# Patient Record
Sex: Female | Born: 1960 | Race: White | Hispanic: No | Smoking: Never smoker
Health system: Southern US, Community
[De-identification: ages and names within clinical notes are randomized; demographics above are authoritative.]

## PROBLEM LIST (undated history)

## (undated) DIAGNOSIS — F419 Anxiety disorder, unspecified: Secondary | ICD-10-CM

## (undated) DIAGNOSIS — F4310 Post-traumatic stress disorder, unspecified: Secondary | ICD-10-CM

## (undated) HISTORY — PX: ABDOMINAL HYSTERECTOMY: SHX81

---

## 2016-08-30 ENCOUNTER — Ambulatory Visit
Admission: EM | Admit: 2016-08-30 | Discharge: 2016-08-30 | Disposition: A | Discharge status: Home / Self Care | Payer: 59 | Attending: Family Medicine | Admitting: Family Medicine

## 2016-08-30 DIAGNOSIS — R070 Pain in throat: Secondary | ICD-10-CM | POA: Diagnosis not present

## 2016-08-30 DIAGNOSIS — J029 Acute pharyngitis, unspecified: Secondary | ICD-10-CM | POA: Diagnosis not present

## 2016-08-30 NOTE — ED Triage Notes (Signed)
Patient complains of sore throat on the outside of her throat when she drops her head. Patient states that she feels like the area is bruised. Patient reports that she noticed discomfort starting on Monday reporting that she felt like she had an "air bubble" in her throat. Patient reports that pain started today.

## 2016-08-30 NOTE — Discharge Instructions (Signed)
Over the counter omeprazole (prilosec)  Continue allergy medications If no improvement/resolution, would recommend follow up evaluation with ENT

## 2016-10-02 NOTE — ED Provider Notes (Signed)
[facility]    CSN: [id number] Arrival date & time: 08/30/16  1547     History   Chief Complaint Chief Complaint  Patient presents with  . Sore Throat    HPI Sherri Powers is a 56 y.o. female.   56 yo female with a c/o throat discomfort that feels more on the outside. Discomfort is worse when dropping her head to look down.  Patient states that she feels like the area is bruised. Patient reports that she noticed discomfort starting on Monday reporting that she felt like she had an "air bubble" in her throat. Patient reports that pain started today. Denies pain with a swallowing, fevers, chills, cough, runny nose.        The history is provided by the patient.  Sore Throat     History reviewed. No pertinent past medical history.  There are no active problems to display for this patient.   Past Surgical History:  Procedure Laterality Date  . ABDOMINAL HYSTERECTOMY      OB History    No data available       Home Medications    Prior to Admission medications   Medication Sig Start Date End Date Taking? Authorizing Provider  buPROPion (WELLBUTRIN XL) 300 MG 24 hr tablet Take 300 mg by mouth daily.   Yes [provider]  venlafaxine (EFFEXOR) 37.5 MG tablet Take 37.5 mg by mouth 2 (two) times daily.   Yes [provider]    Family History History reviewed. No pertinent family history.  Social History Social History  Substance Use Topics  . Smoking status: Never Smoker  . Smokeless tobacco: Never Used  . Alcohol use Yes     Comment: occasionally     Allergies   Codeine; Morphine and related; and Sulfa antibiotics   Review of Systems Review of Systems   Physical Exam Triage Vital Signs ED Triage Vitals  Enc Vitals Group     BP 08/30/16 1614 138/79     Pulse Rate 08/30/16 1614 80     Resp 08/30/16 1614 18     Temp 08/30/16 1614 98.4 F (36.9 C)     Temp Source 08/30/16 1614 Oral     SpO2 08/30/16 1614 100 %    Weight 08/30/16 1611 175 lb (79.4 kg)     Height 08/30/16 1611 5\' 4"  (1.626 m)     Head Circumference --      Peak Flow --      Pain Score 08/30/16 1611 1     Pain Loc --      Pain Edu? --      Excl. in GC? --    No data found.   Updated Vital Signs BP 138/79 (BP Location: Left Arm)   Pulse 80   Temp 98.4 F (36.9 C) (Oral)   Resp 18   Ht 5\' 4"  (1.626 m)   Wt 175 lb (79.4 kg)   SpO2 100%   BMI 30.04 kg/m   Visual Acuity Right Eye Distance:   Left Eye Distance:   Bilateral Distance:    Right Eye Near:   Left Eye Near:    Bilateral Near:     Physical Exam  Constitutional: She appears well-developed and well-nourished. No distress.  HENT:  Head: Normocephalic and atraumatic.  Right Ear: Tympanic membrane, external ear and ear canal normal.  Left Ear: Tympanic membrane, external ear and ear canal normal.  Nose: No mucosal edema, rhinorrhea, nose lacerations, sinus tenderness, nasal deformity,  septal deviation or nasal septal hematoma. No epistaxis.  No foreign bodies. Right sinus exhibits no maxillary sinus tenderness and no frontal sinus tenderness. Left sinus exhibits no maxillary sinus tenderness and no frontal sinus tenderness.  Mouth/Throat: Uvula is midline, oropharynx is clear and moist and mucous membranes are normal. No oropharyngeal exudate.  Eyes: Conjunctivae and EOM are normal. Pupils are equal, round, and reactive to light. Right eye exhibits no discharge. Left eye exhibits no discharge. No scleral icterus.  Neck: Normal range of motion. Neck supple. No thyromegaly present.  Cardiovascular: Normal rate, regular rhythm and normal heart sounds.   Pulmonary/Chest: Effort normal and breath sounds normal. No respiratory distress. She has no wheezes. She has no rales.  Lymphadenopathy:    She has no cervical adenopathy.  Skin: She is not diaphoretic.  Nursing note and vitals reviewed.    UC Treatments / Results  Labs (all labs ordered are listed, but only  abnormal results are displayed) Labs Reviewed - No data to display  EKG  EKG Interpretation None       Radiology No results found.  Procedures Procedures (including critical care time)  Medications Ordered in UC Medications - No data to display   Initial Impression / Assessment and Plan / UC Course  I have reviewed the triage vital signs and the nursing notes.  Pertinent labs & imaging results that were available during my care of the patient were reviewed by me and considered in my medical decision making (see chart for details).       Final Clinical Impressions(s) / UC Diagnoses   Final diagnoses:  Throat discomfort    New Prescriptions Discharge Medication List as of 08/30/2016  4:59 PM     1. diagnosis reviewed with patient 2. Recommend supportive treatment with otc analgesics, ice to area 3. Follow-up prn if symptoms worsen or don't improve   Payton Mccallumonty, Kevan Prouty, MD 10/02/16 1309

## 2018-05-18 ENCOUNTER — Other Ambulatory Visit: Payer: Self-pay | Admitting: Family Medicine

## 2018-05-18 DIAGNOSIS — Z1231 Encounter for screening mammogram for malignant neoplasm of breast: Secondary | ICD-10-CM

## 2018-08-17 ENCOUNTER — Emergency Department
Admission: EM | Admit: 2018-08-17 | Discharge: 2018-08-17 | Disposition: A | Discharge status: Home / Self Care | Payer: 59 | Attending: Emergency Medicine | Admitting: Emergency Medicine

## 2018-08-17 ENCOUNTER — Encounter: Payer: Self-pay | Admitting: Emergency Medicine

## 2018-08-17 ENCOUNTER — Ambulatory Visit (INDEPENDENT_AMBULATORY_CARE_PROVIDER_SITE_OTHER)
Admission: EM | Admit: 2018-08-17 | Discharge: 2018-08-17 | Disposition: A | Discharge status: Nursing Home (Includes ICF) | Payer: 59 | Source: Home / Self Care | Attending: Family Medicine | Admitting: Family Medicine

## 2018-08-17 ENCOUNTER — Emergency Department: Payer: 59

## 2018-08-17 ENCOUNTER — Other Ambulatory Visit: Payer: Self-pay

## 2018-08-17 DIAGNOSIS — R0789 Other chest pain: Secondary | ICD-10-CM | POA: Insufficient documentation

## 2018-08-17 DIAGNOSIS — Z79899 Other long term (current) drug therapy: Secondary | ICD-10-CM | POA: Insufficient documentation

## 2018-08-17 DIAGNOSIS — R0602 Shortness of breath: Secondary | ICD-10-CM | POA: Diagnosis not present

## 2018-08-17 DIAGNOSIS — R079 Chest pain, unspecified: Secondary | ICD-10-CM

## 2018-08-17 HISTORY — DX: Post-traumatic stress disorder, unspecified: F43.10

## 2018-08-17 HISTORY — DX: Anxiety disorder, unspecified: F41.9

## 2018-08-17 LAB — COMPREHENSIVE METABOLIC PANEL
ALT: 21 U/L (ref 0–44)
AST: 23 U/L (ref 15–41)
Albumin: 4 g/dL (ref 3.5–5.0)
Alkaline Phosphatase: 76 U/L (ref 38–126)
Anion gap: 8 (ref 5–15)
BUN: 16 mg/dL (ref 6–20)
CO2: 22 mmol/L (ref 22–32)
Calcium: 8.7 mg/dL — ABNORMAL LOW (ref 8.9–10.3)
Chloride: 111 mmol/L (ref 98–111)
Creatinine, Ser: 0.93 mg/dL (ref 0.44–1.00)
GFR calc Af Amer: >60 mL/min (ref >60)
GFR calc non Af Amer: >60 mL/min (ref >60)
Glucose, Bld: 117 mg/dL — ABNORMAL HIGH (ref 70–99)
Potassium: 3.7 mmol/L (ref 3.5–5.1)
Sodium: 141 mmol/L (ref 135–145)
Total Bilirubin: 0.8 mg/dL (ref 0.3–1.2)
Total Protein: 7 g/dL (ref 6.5–8.1)

## 2018-08-17 LAB — CBC WITH DIFFERENTIAL/PLATELET
Abs Immature Granulocytes: 0.01 10*3/uL (ref 0.00–0.07)
Basophils Absolute: 0.1 10*3/uL (ref 0.0–0.1)
Basophils Relative: 1 %
Eosinophils Absolute: 0.3 10*3/uL (ref 0.0–0.5)
Eosinophils Relative: 4 %
HCT: 40.8 % (ref 36.0–46.0)
Hemoglobin: 13.9 g/dL (ref 12.0–15.0)
Immature Granulocytes: 0 %
Lymphocytes Relative: 35 %
Lymphs Abs: 2.5 10*3/uL (ref 0.7–4.0)
MCH: 31.9 pg (ref 26.0–34.0)
MCHC: 34.1 g/dL (ref 30.0–36.0)
MCV: 93.6 fL (ref 80.0–100.0)
Monocytes Absolute: 0.8 10*3/uL (ref 0.1–1.0)
Monocytes Relative: 11 %
Neutro Abs: 3.6 10*3/uL (ref 1.7–7.7)
Neutrophils Relative %: 49 %
Platelets: 293 10*3/uL (ref 150–400)
RBC: 4.36 MIL/uL (ref 3.87–5.11)
RDW: 12.2 % (ref 11.5–15.5)
WBC: 7.3 10*3/uL (ref 4.0–10.5)
nRBC: 0 % (ref 0.0–0.2)

## 2018-08-17 LAB — URINALYSIS, COMPLETE (UACMP) WITH MICROSCOPIC
Bilirubin Urine: NEGATIVE
Glucose, UA: NEGATIVE mg/dL
Hgb urine dipstick: NEGATIVE
Ketones, ur: NEGATIVE mg/dL
Nitrite: NEGATIVE
Protein, ur: NEGATIVE mg/dL
Specific Gravity, Urine: 1.023 (ref 1.005–1.030)
pH: 5 (ref 5.0–8.0)

## 2018-08-17 LAB — TROPONIN I
Troponin I: <0.03 ng/mL (ref <0.03)
Troponin I: <0.03 ng/mL (ref <0.03)

## 2018-08-17 MED ORDER — ASPIRIN 81 MG PO CHEW
324.0000 mg | CHEWABLE_TABLET | Freq: Once | ORAL | Status: AC
  Administered 2018-08-17: 18:00:00 324 mg via ORAL

## 2018-08-17 MED ORDER — NITROGLYCERIN 2 % TD OINT
0.5000 [in_us] | TOPICAL_OINTMENT | Freq: Once | TRANSDERMAL | Status: AC
  Administered 2018-08-17: 18:00:00 0.5 [in_us] via TOPICAL

## 2018-08-17 MED ORDER — ASPIRIN EC 325 MG PO TBEC: 325.0000 mg | DELAYED_RELEASE_TABLET | Freq: Every day | ORAL | 0 refills | Status: DC

## 2018-08-17 NOTE — ED Provider Notes (Signed)
[facility]    CSN: [id number] Arrival date & time: 08/17/18  1745     History   Chief Complaint Chief Complaint  Patient presents with  . Chest Pain    HPI Sherri Powers is a 58 y.o. female.   58 yo female with a c/o left upper chest pressure radiating into her left neck, jaw and left shoulder area since 11 am this morning. States pressure has been constant and 5/10. Denies shortness of breath, cough, fevers, wheezing. States over the last week she's been feeling "palpitations" or her heart "fluttering".  Risk factors: hypercholesterolemia, family history  The history is provided by the patient.  Chest Pain  Pain location:  L lateral chest   Past Medical History:  Diagnosis Date  . Anxiety   . PTSD (post-traumatic stress disorder)     There are no active problems to display for this patient.   Past Surgical History:  Procedure Laterality Date  . ABDOMINAL HYSTERECTOMY      OB History   No obstetric history on file.      Home Medications    Prior to Admission medications   Medication Sig Start Date End Date Taking? Authorizing Provider  buPROPion (WELLBUTRIN XL) 300 MG 24 hr tablet Take 300 mg by mouth daily.   Yes [provider]  hydrALAZINE (APRESOLINE) 10 MG tablet Take 10 mg by mouth 3 (three) times daily.   Yes [provider]  venlafaxine (EFFEXOR) 37.5 MG tablet Take 37.5 mg by mouth 2 (two) times daily.   Yes [provider]    Family History History reviewed. No pertinent family history.  Social History Social History   Tobacco Use  . Smoking status: Never Smoker  . Smokeless tobacco: Never Used  Substance Use Topics  . Alcohol use: Yes    Comment: occasionally  . Drug use: No     Allergies   Codeine; Morphine and related; and Sulfa antibiotics   Review of Systems Review of Systems  Cardiovascular: Positive for chest pain.     Physical Exam Triage Vital Signs ED Triage Vitals  Enc  Vitals Group     BP 08/17/18 1755 140/77     Pulse Rate 08/17/18 1755 72     Resp 08/17/18 1755 18     Temp 08/17/18 1755 98.4 F (36.9 C)     Temp Source 08/17/18 1755 Oral     SpO2 08/17/18 1755 99 %     Weight 08/17/18 1752 178 lb (80.7 kg)     Height 08/17/18 1752 5\' 4"  (1.626 m)     Head Circumference --      Peak Flow --      Pain Score 08/17/18 1752 7     Pain Loc --      Pain Edu? --      Excl. in GC? --    No data found.  Updated Vital Signs BP 140/77 (BP Location: Right Arm)   Pulse 72   Temp 98.4 F (36.9 C) (Oral)   Resp 18   Ht 5\' 4"  (1.626 m)   Wt 80.7 kg   SpO2 99%   BMI 30.55 kg/m   Visual Acuity Right Eye Distance:   Left Eye Distance:   Bilateral Distance:    Right Eye Near:   Left Eye Near:    Bilateral Near:     Physical Exam Vitals signs reviewed.  Constitutional:      General: She is not in acute distress.  Appearance: She is not ill-appearing, toxic-appearing or diaphoretic.  Cardiovascular:     Rate and Rhythm: Normal rate and regular rhythm.     Pulses: Normal pulses.     Heart sounds: Normal heart sounds.  Pulmonary:     Effort: Pulmonary effort is normal. No respiratory distress.     Breath sounds: Normal breath sounds. No stridor. No wheezing, rhonchi or rales.  Musculoskeletal:     Right lower leg: No edema.     Left lower leg: No edema.  Neurological:     Mental Status: She is alert.      UC Treatments / Results  Labs (all labs ordered are listed, but only abnormal results are displayed) Labs Reviewed - No data to display  EKG None  Radiology No results found.  Procedures ED EKG Date/Time: 08/17/2018 6:23 PM Performed by: Payton Mccallumonty, Asheton Viramontes, MD Authorized by: Payton Mccallumonty, Dinesha Twiggs, MD   ECG reviewed by ED Physician in the absence of a cardiologist: yes   Previous ECG:    Previous ECG:  Compared to current   Similarity:  Changes noted Interpretation:    Interpretation: abnormal   Rate:    ECG rate assessment:  normal   Rhythm:    Rhythm: sinus rhythm   Ectopy:    Ectopy: none   QRS:    QRS axis:  Normal Conduction:    Conduction: normal   ST segments:    ST segments:  Non-specific T waves:    T waves: flattening and inverted     Flattening:  V2   Inverted:  V1   (including critical care time)  Medications Ordered in UC Medications  aspirin chewable tablet 324 mg (324 mg Oral Given 08/17/18 1819)  nitroGLYCERIN (NITROGLYN) 2 % ointment 0.5 inch (0.5 inches Topical Given 08/17/18 1820)    Initial Impression / Assessment and Plan / UC Course  I have reviewed the triage vital signs and the nursing notes.  Pertinent labs & imaging results that were available during my care of the patient were reviewed by me and considered in my medical decision making (see chart for details).      Final Clinical Impressions(s) / UC Diagnoses   Final diagnoses:  Left chest pressure    ED Prescriptions    None      1. Symptoms, ekg results and potential diagnoses reviewed with patient; recommend patient go to ED by EMS for further evaluation and management; patient given ASA, 1/2 in Nitropaste, IV saline lock. Patient in stable condition. [facility] ED.     Controlled Substance Prescriptions [place] Controlled Substance Registry consulted? Not Applicable   Payton Mccallumonty, Ilianna Bown, MD 08/17/18 [phone]

## 2018-08-17 NOTE — ED Triage Notes (Signed)
Pt presents to ED via [facility] from [facility] c/o L-sided chest pressure starting today and radiating into L arm. Reports feeling of fluttering in chest over the last few weeks. Pt given 324mg  ASA and 1" nitro paste to L chest at Urgent Care. Pt rated chest pressure 7/10 at [facility] and 4/10 on arrival to ED. Denies SOB.

## 2018-08-17 NOTE — ED Triage Notes (Addendum)
Patient c/o chest pressure that radiates into her left arm and left shoulder that started today. She states over the last 2 weeks she has woken up out of her sleep twice with heart fluttering.

## 2018-08-17 NOTE — ED Provider Notes (Signed)
[facility] Emergency Department Provider Note  ____________________________________________   None    (approximate)  I have reviewed the triage vital signs and the nursing notes.   HISTORY  Chief Complaint Chest Pain    HPI Sherri Powers is a 58 y.o. female presents emergency department from  urgent care complaining of chest pain and shortness of breath.  Pain radiates into the left jaw and left shoulder.  Some into the left arm.  She has been having some heart fluttering and palpitations over the past week.  She denies any pain in her legs.  No abdominal pain.  No vomiting.  She was given 4 aspirin and a nitro patch while at the  urgent care.   Patient's mother had a history of A. fib , father had a MI at age 60.  Patient has mildly elevated cholesterol in which she is not on medication for.  History of anxiety.  Non-smoker, patient also had a tick bite in March and has had a low-grade temp for the past week.  No known exposure to covid.  Past Medical History:  Diagnosis Date  . Anxiety   . PTSD (post-traumatic stress disorder)     There are no active problems to display for this patient.   Past Surgical History:  Procedure Laterality Date  . ABDOMINAL HYSTERECTOMY      Prior to Admission medications   Medication Sig Start Date End Date Taking? Authorizing Provider  buPROPion (WELLBUTRIN XL) 300 MG 24 hr tablet Take 300 mg by mouth daily.    [provider]  hydrALAZINE (APRESOLINE) 10 MG tablet Take 10 mg by mouth 3 (three) times daily.    [provider]  venlafaxine (EFFEXOR) 37.5 MG tablet Take 37.5 mg by mouth 2 (two) times daily.    [provider]    Allergies Codeine; Morphine and related; and Sulfa antibiotics  History reviewed. No pertinent family history.  Social History Social History   Tobacco Use  . Smoking status: Never Smoker  . Smokeless tobacco: Never Used  Substance Use Topics  . Alcohol  use: Yes    Comment: occasionally  . Drug use: No    Review of Systems  Constitutional: No fever/chills Eyes: No visual changes. ENT: No sore throat. Respiratory: Denies cough Cardiovascular: Positive for chest pain Genitourinary: Negative for dysuria. Musculoskeletal: Negative for back pain. Skin: Negative for rash.    ____________________________________________   PHYSICAL EXAM:  VITAL SIGNS: ED Triage Vitals  Enc Vitals Group     BP 08/17/18 1911 (!) 147/92     Pulse Rate 08/17/18 1911 69     Resp 08/17/18 1911 16     Temp 08/17/18 1911 99 F (37.2 C)     Temp Source 08/17/18 1911 Oral     SpO2 08/17/18 1911 99 %     Weight 08/17/18 1913 178 lb (80.7 kg)     Height 08/17/18 1913 5\' 4"  (1.626 m)     Head Circumference --      Peak Flow --      Pain Score 08/17/18 1912 4     Pain Loc --      Pain Edu? --      Excl. in GC? --     Constitutional: Alert and oriented. Well appearing and in no acute distress. Eyes: Conjunctivae are normal.  Head: Atraumatic. Nose: No congestion/rhinnorhea. Mouth/Throat: Mucous membranes are moist.   Neck:  supple no lymphadenopathy noted Cardiovascular: Normal rate, regular rhythm. Heart  sounds are normal Respiratory: Normal respiratory effort.  No retractions, lungs c t a  Abd: soft nontender bs normal all 4 quad GU: deferred Musculoskeletal: FROM all extremities, warm and well perfused Neurologic:  Normal speech and language.  Skin:  Skin is warm, dry and intact. No rash noted. Psychiatric: Mood and affect are normal. Speech and behavior are normal.  ____________________________________________   LABS (all labs ordered are listed, but only abnormal results are displayed)  Labs Reviewed  COMPREHENSIVE METABOLIC PANEL - Abnormal; Notable for the following components:      Result Value   Glucose, Bld 117 (*)    Calcium 8.7 (*)    All other components within normal limits  URINALYSIS, COMPLETE (UACMP) WITH MICROSCOPIC -  Abnormal; Notable for the following components:   Color, Urine YELLOW (*)    APPearance HAZY (*)    Leukocytes,Ua LARGE (*)    Bacteria, UA RARE (*)    All other components within normal limits  TROPONIN I  CBC WITH DIFFERENTIAL/PLATELET  TROPONIN I   ____________________________________________   ____________________________________________  RADIOLOGY  Chest x-ray is normal  ____________________________________________   PROCEDURES  Procedure(s) performed: No  Procedures    ____________________________________________   INITIAL IMPRESSION / ASSESSMENT AND PLAN / ED COURSE  Pertinent labs & imaging results that were available during my care of the patient were reviewed by me and considered in my medical decision making (see chart for details).   Patient is a 58 year old female presents emergency department chest pain which radiates into the left side of the neck and left shoulder.  She was seen at the urgent care and given 4 aspirin and a nitro patch.  Physical exam patient appears well.  Blood pressure is mildly elevated at 147/92.  Remainder the exam is basically unremarkable.    ----------------------------------------- 8:35 PM on 08/17/2018 -----------------------------------------  Troponin is normal, comprehensive metabolic panel is normal, CBC is normal, urinalysis shows large amount of leuks with WBCs at 11-12 with rare bacteria  Explained the test results to the patient.  Explained her we will do a second troponin at 10:00.  She states she still feels the pressure of something pushing forward in her chest. Discussed with Dr. Scotty CourtStafford.  As part of my medical decision making, I reviewed the following data within the electronic MEDICAL RECORD NUMBER Nursing notes reviewed and incorporated, Labs reviewed see above, EKG interpreted NSR, Old chart reviewed, Radiograph reviewed chest x-ray is normal, Evaluated by EM attending Dr. Scotty CourtStafford, Notes from prior ED visits and  [place] Controlled Substance Database  ____________________________________________   FINAL CLINICAL IMPRESSION(S) / ED DIAGNOSES  Final diagnoses:  Nonspecific chest pain      NEW MEDICATIONS STARTED DURING THIS VISIT:  New Prescriptions   No medications on file     Note:  This document was prepared using Dragon voice recognition software and may include unintentional dictation errors.    Faythe GheeFisher,  W, PA-C 08/17/18 2101    Sharman CheekStafford, Phillip, MD 08/17/18 2316    Sharman CheekStafford, Phillip, MD 08/17/18 2317

## 2018-08-18 NOTE — Progress Notes (Signed)
Virtual Visit via Video Note   This visit type was conducted due to national recommendations for restrictions regarding the COVID-19 Pandemic (e.g. social distancing) in an effort to limit this patient's exposure and mitigate transmission in our community.  Due to her co-morbid illnesses, this patient is at least at moderate risk for complications without adequate follow up.  This format is felt to be most appropriate for this patient at this time.  All issues noted in this document were discussed and addressed.  A limited physical exam was performed with this format.  Please refer to the patient's chart for her consent to telehealth for [facility].   Evaluation Performed:  New patient visit  Date:  08/19/2018   ID:  Sherri Powers, DOB 05-24-1960, MRN [id number]  Patient Location: Home Provider Location: Office  PCP:  [facility]  Cardiologist:  New - Yvonne Kendallhristopher Sybilla Malhotra, MD Electrophysiologist:  None   Chief Complaint: Palpitations and chest pain  History of Present Illness:    Sherri CellarMeredith Powers is a 58 y.o. female with history of anxiety and PTSD, whom I am seeing for evaluation of chest pain.  She presented to [facility] yesterday complaining of left upper chest pain radiating to the left shoulder and neck that began around 11 AM.  She also noted palpitations over the last week.  She was referred to [facility] ED by EMS for further evaluation.  There, workup was largely unrevealing (EKG with non-specific ST changes but otherwise normal; negative troponin x 2) and the patient discharged with plan for close cardiology follow-up.  Today, Sherri Powers reports that her symptoms began in March when she was forced to start working from home in the setting of COVID-19 precautions.  She intermittently experienced palpitations during which it would feel like her heart was flipping and flopping in her chest.  Symptoms are most pronounced at night.  2 days ago, she began noticing  pressure along the medial aspect of her left upper arm.  Over the course of 90 minutes, it began to migrate medially towards her left shoulder and upper chest.  Maximal intensity was 7/10.  She felt a little lightheaded, but otherwise there were no associated symptoms.  Because of the worsening discomfort, she proceeded to urgent care and was subsequently sent to [facility] emergency department, as noted above.  Pain resolved on its own shortly after arriving in the ED.  It had not recurred, though she notes transient pressure near the left clavicle at the Arliss Hepburn of our visit today.  Sherri Powers has been more sedentary over the last 2 months, working from home.  However, she tries to walk on her property and is able to do so without chest pain or shortness of breath.  She also denies orthopnea, PND, and edema.  She does not have a history of cardiac disease nor has she undergone cardiac testing in the past.  She consumes 1 cup of caffeinated coffee per day.  The patient does not have symptoms concerning for COVID-19 infection (fever, chills, cough, or new shortness of breath).    Past Medical History:  Diagnosis Date  . Anxiety   . PTSD (post-traumatic stress disorder)    Past Surgical History:  Procedure Laterality Date  . ABDOMINAL HYSTERECTOMY       Current Meds  Medication Sig  . aspirin EC 325 MG tablet Take 1 tablet (325 mg total) by mouth daily.  Marland Kitchen. buPROPion (WELLBUTRIN XL) 300 MG 24 hr tablet Take 300  mg by mouth daily.  Marland Kitchen venlafaxine XR (EFFEXOR-XR) 75 MG 24 hr capsule Take 75 mg by mouth daily.      Allergies:   Codeine; Morphine and related; and Sulfa antibiotics   Social History   Tobacco Use  . Smoking status: Never Smoker  . Smokeless tobacco: Never Used  Substance Use Topics  . Alcohol use: Yes    Alcohol/week: 7.0 standard drinks    Types: 7 Glasses of wine per week  . Drug use: No     Family Hx: The patient's family history includes Heart attack (age of onset: 39) in  her father; Heart disease in her mother.  ROS:   Please see the history of present illness.   All other systems reviewed and are negative.   Prior CV studies:   The following studies were reviewed today:  CXR (08/17/2018): No active cardiopulmonary disease.  Labs/Other Tests and Data Reviewed:    EKG:  An ECG dated August 17, 2018 was personally reviewed today and demonstrated:  NSR with early R wave transition and non-specific ST segment changes.  Recent Labs: 08/17/2018: ALT 21; BUN 16; Creatinine, Ser 0.93; Hemoglobin 13.9; Platelets 293; Potassium 3.7; Sodium 141   Recent Lipid Panel No results found for: CHOL, TRIG, HDL, CHOLHDL, LDLCALC, LDLDIRECT  Wt Readings from Last 3 Encounters:  08/19/18 178 lb (80.7 kg)  08/17/18 178 lb (80.7 kg)  08/17/18 178 lb (80.7 kg)     Objective:    Vital Signs:  BP (!) 146/82 (BP Location: Left Arm, Patient Position: Sitting, Cuff Size: Normal)   Pulse 65   Ht 5\' 4"  (1.626 m)   Wt 178 lb (80.7 kg)   BMI 30.55 kg/m    VITAL SIGNS:  reviewed GEN:  no acute distress RESPIRATORY:  normal respiratory effort, symmetric expansion  ASSESSMENT & PLAN:    Palpitations: Present for about 2 months, most pronounced at night and happening roughly once or twice a week.  Patient feels like stress may have triggered palpitations as well as chest pain.  We have agreed to obtain a 14-day event monitor for further assessment.  I encouraged Sherri Powers to minimize her caffeine and alcohol intake.  Chest pain: Sherri Powers reports a single episode of atypical chest pain that lasted about 7 hours and began in the left upper arm and progressively migrated towards her chest.  She was evaluated in the emergency department with negative troponins.  I have personally reviewed her EKGs from urgent care and the ED, which show subtle, nonspecific ST segment changes that were less pronounced in the ED.  Her only cardiac risk factor is obesity.  I agree that stress may be  contributing to her symptoms.  Given palpitations, we have agreed to begin with an event monitor.  If symptoms recur and/or significant arrhythmia is identified on the monitor, we will need to consider stress testing +/-echocardiogram.  Elevated blood pressure: Single elevated blood pressure reading noted today.  Sherri Powers reports that her blood pressure is usually around 120/70.  Stress regarding recent ED visit and today's virtual visit may be contributing.  I have asked her to continue monitoring her blood pressure.  We will defer medication changes today.  COVID-19 Education: The signs and symptoms of COVID-19 were discussed with the patient and how to seek care for testing (follow up with PCP or arrange E-visit).  The importance of social distancing was discussed today.  Time:   Today, I have spent 20 minutes with the  patient with telehealth technology discussing the above problems.  An additional 10 minutes were spent reviewing the patient's chart and documenting today's encounter.   Medication Adjustments/Labs and Tests Ordered: Current medicines are reviewed at length with the patient today.  Concerns regarding medicines are outlined above.   Tests Ordered: 14-day event monitor (ZIO Patch)  Medication Changes: None  Disposition:  Follow up in 6 week(s) Signed, Yvonne Kendall, MD  08/19/2018 4:17 PM    [facility]

## 2018-08-19 ENCOUNTER — Telehealth: Payer: Self-pay | Admitting: *Deleted

## 2018-08-19 ENCOUNTER — Other Ambulatory Visit: Payer: Self-pay

## 2018-08-19 ENCOUNTER — Encounter: Payer: Self-pay | Admitting: Internal Medicine

## 2018-08-19 ENCOUNTER — Telehealth (INDEPENDENT_AMBULATORY_CARE_PROVIDER_SITE_OTHER): Payer: 59 | Admitting: Internal Medicine

## 2018-08-19 VITALS — BP 146/82 | HR 65 | Ht 64.0 in | Wt 178.0 lb

## 2018-08-19 DIAGNOSIS — R0789 Other chest pain: Secondary | ICD-10-CM | POA: Diagnosis not present

## 2018-08-19 DIAGNOSIS — R002 Palpitations: Secondary | ICD-10-CM | POA: Insufficient documentation

## 2018-08-19 DIAGNOSIS — R030 Elevated blood-pressure reading, without diagnosis of hypertension: Secondary | ICD-10-CM | POA: Diagnosis not present

## 2018-08-19 NOTE — Telephone Encounter (Signed)
Called patient to discuss AVS from virtual visit with Dr End this afternoon. She verbalized understanding of instructions as listed on AVS. Follow up appointment scheduled. Patient registered on ZIO website.

## 2018-08-19 NOTE — Patient Instructions (Signed)
Medication Instructions:  Your physician recommends that you continue on your current medications as directed. Please refer to the Current Medication list given to you today.  If you need a refill on your cardiac medications before your next appointment, please call your pharmacy.   Lab work: none If you have labs (blood work) drawn today and your tests are completely normal, you will receive your results only by: Marland Kitchen MyChart Message (if you have MyChart) OR . A paper copy in the mail If you have any lab test that is abnormal or we need to change your treatment, we will call you to review the results.  Testing/Procedures: Your physician has recommended that you wear an ZIO 14 DAY event monitor. Event monitors are medical devices that record the heart's electrical activity. Doctors most often [place] these monitors to diagnose arrhythmias. Arrhythmias are problems with the speed or rhythm of the heartbeat. The monitor is a small, portable device. You can wear one while you do your normal daily activities. This is usually used to diagnose what is causing palpitations/syncope (passing out).  This will be directly shipped to you from [facility]- they will call you just prior to receipt of the monitor or when you actually receive the monitor.  If you get a call from an 800# or a 224 area code in the next 2-5 business days, then please answer the call.    Follow-Up: At [facility], you and your health needs are our priority.  As part of our continuing mission to provide you with exceptional heart care, we have created designated Provider Care Teams.  These Care Teams include your primary Cardiologist (physician) and Advanced Practice Providers (APPs -  Physician Assistants and Nurse Practitioners) who all work together to provide you with the care you need, when you need it. You will need a follow up appointment in 6 weeks.  Please call our office 2 months in advance to schedule this appointment.  You may  see DR Cristal Deer END or one of the following Advanced Practice Providers on your designated Care Team:   Nicolasa Ducking, NP Eula Listen, PA-C . Marisue Ivan, PA-C

## 2018-08-24 ENCOUNTER — Telehealth: Payer: Self-pay | Admitting: Internal Medicine

## 2018-08-24 NOTE — Telephone Encounter (Signed)
I spoke with the patient. I advised her that I called [employer] regarding her Zio monitor. She is aware that they mailed this out on 08/19/18- they tell me that in tracking it, she should be receiving this by the end of the day today. I asked her to call back tomorrow if not received by lunchtime.  We did discuss her lab tests & chest x-ray from the hospital.  All of her questions were answered. The patient was very appreciative for the call back.

## 2018-08-24 NOTE — Telephone Encounter (Signed)
Please call regarding monitor , pt states she has not heard from the company.  Pt also states that she has noticed when she lays down, her heart "flip flops". She would also like to know what heart tests were performed when she was in the hospital. Please call to discuss.

## 2018-08-26 ENCOUNTER — Telehealth: Payer: Self-pay | Admitting: Internal Medicine

## 2018-08-26 NOTE — Telephone Encounter (Signed)
Patient wanted to let us know she has not received her monitor as of yet.

## 2018-08-26 NOTE — Telephone Encounter (Signed)
Called [employer] and spoke with representative. States the monitor is in transit with the [employer], though with the COVID pandemic it is taking 5-7 business days. She could not give me an expected delivery date.  Called patient and let her know. She was very appreciative and will let us know if she does not receive it by next week.

## 2018-08-29 ENCOUNTER — Ambulatory Visit (INDEPENDENT_AMBULATORY_CARE_PROVIDER_SITE_OTHER): Payer: 59

## 2018-08-29 DIAGNOSIS — R002 Palpitations: Secondary | ICD-10-CM | POA: Diagnosis not present

## 2018-09-09 ENCOUNTER — Telehealth: Payer: Self-pay | Admitting: Internal Medicine

## 2018-09-09 NOTE — Telephone Encounter (Signed)
Patient calling If possible, patient would like a letter indicating she can work from home until she finds out her diagnosis Please call to discuss

## 2018-09-10 ENCOUNTER — Encounter: Payer: Self-pay | Admitting: Internal Medicine

## 2018-09-10 NOTE — Telephone Encounter (Signed)
No answer. Left message to call back.   

## 2018-09-10 NOTE — Telephone Encounter (Signed)
No answer. Left message with patient that Dr End has a letter for her. Asked her to call me back to let me know how she would like the letter sent to her (fax or mail or pick up). Let her know she could just let us know.

## 2018-09-11 NOTE — Telephone Encounter (Signed)
Called and spoke with patient. States she would like letter emailed or sent to [employer]. I recommended MyChart as the preferred way to provide information per policy. She said she was sent the link yesterday to set up her MyChart but has not yet. She will do this later today and let us know when she does so I can route the letter to her. Meanwhile, I suggested I will mail her the original and she was agreeable.  She was very Adult nurse.

## 2018-09-16 NOTE — Telephone Encounter (Signed)
Patient has not signed up for MyChart at this time.

## 2018-09-23 ENCOUNTER — Other Ambulatory Visit: Payer: Self-pay | Admitting: *Deleted

## 2018-09-23 ENCOUNTER — Other Ambulatory Visit: Payer: Self-pay

## 2018-09-23 DIAGNOSIS — R002 Palpitations: Secondary | ICD-10-CM

## 2018-09-25 ENCOUNTER — Telehealth: Payer: Self-pay | Admitting: *Deleted

## 2018-09-25 NOTE — Telephone Encounter (Signed)
Patient returning call.

## 2018-09-25 NOTE — Telephone Encounter (Signed)
Pt verbalized understanding and will call if anything worsens prior to her appt 10/09/18.

## 2018-09-25 NOTE — Telephone Encounter (Signed)
No answer. Left message to call back.   

## 2018-09-25 NOTE — Telephone Encounter (Signed)
-----   Message from Yvonne Kendall, MD sent at 09/25/2018 11:18 AM EDT ----- Please let Ms. Bailer know that her heart monitor showed rare extra beats but no significant arrhythmia to explain her palpitations.  We will follow-up as planned to reassess her symptoms.

## 2018-10-09 ENCOUNTER — Telehealth (INDEPENDENT_AMBULATORY_CARE_PROVIDER_SITE_OTHER): Payer: 59 | Admitting: Internal Medicine

## 2018-10-09 ENCOUNTER — Other Ambulatory Visit: Payer: Self-pay

## 2018-10-09 VITALS — BP 127/82 | HR 71 | Ht 63.0 in | Wt 175.0 lb

## 2018-10-09 DIAGNOSIS — I491 Atrial premature depolarization: Secondary | ICD-10-CM | POA: Diagnosis not present

## 2018-10-09 DIAGNOSIS — I493 Ventricular premature depolarization: Secondary | ICD-10-CM | POA: Diagnosis not present

## 2018-10-09 DIAGNOSIS — R079 Chest pain, unspecified: Secondary | ICD-10-CM

## 2018-10-09 NOTE — Patient Instructions (Signed)
Medication Instructions:  Your physician recommends that you continue on your current medications as directed. Please refer to the Current Medication list given to you today.  If you need a refill on your cardiac medications before your next appointment, please call your pharmacy.   Lab work: - None ordered.  If you have labs (blood work) drawn today and your tests are completely normal, you will receive your results only by: . MyChart Message (if you have MyChart) OR . A paper copy in the mail If you have any lab test that is abnormal or we need to change your treatment, we will call you to review the results.  Testing/Procedures: - None ordered.   Follow-Up: At [facility], you and your health needs are our priority.  As part of our continuing mission to provide you with exceptional heart care, we have created designated Provider Care Teams.  These Care Teams include your primary Cardiologist (physician) and Advanced Practice Providers (APPs -  Physician Assistants and Nurse Practitioners) who all work together to provide you with the care you need, when you need it. You will need a follow up appointment in AS NEEDED BASIS.     

## 2018-10-09 NOTE — Progress Notes (Signed)
Virtual Visit via Video Note   This visit type was conducted due to national recommendations for restrictions regarding the COVID-19 Pandemic (e.g. social distancing) in an effort to limit this patient's exposure and mitigate transmission in our community.  Due to her co-morbid illnesses, this patient is at least at moderate risk for complications without adequate follow up.  This format is felt to be most appropriate for this patient at this time.  All issues noted in this document were discussed and addressed.  A limited physical exam was performed with this format.  Please refer to the patient's chart for her consent to telehealth for [facility].   Date:  10/09/2018   ID:  Sherri Powers, DOB 11-Apr-1961, MRN [id number]  Patient Location: Home Provider Location: Office  PCP:  [facility] [place]  Cardiologist:  Nelva Bush, MD Electrophysiologist:  None   Evaluation Performed:  Follow-Up Visit  Chief Complaint:  Follow-up palpitations  History of Present Illness:    Sherri Powers is a 58 y.o. female with history of anxiety and PTSD.  We are speaking today for follow-up of palpitations and chest pain.  We last spoke during a virtual visit in the late April, when Sherri Powers reported palpitations that began in March.  She also reported a 2-day history of left upper arm pain that began radiating towards her chest.  Preceding ED work-up was unrevealing.  Subsequent 14-day event monitor showed predominantly sinus rhythm with rare PACs and PVCs.  No significant arrhythmia was identified.  Today, Sherri Powers reports that she has been doing well.  She continues to note rare brief palpitations, albeit less frequently than when we last spoke.  She was reassured by the results of her event monitor.  She has not had any further chest pain or left arm discomfort.  She has occasional brief dyspnea, though this too is improving as she becomes less anxious about recent events.  She denies  lightheadedness and edema.  She has put on a little weight, as she is more sedentary working from home.  She is trying to walk more around her home.  The patient does not have symptoms concerning for COVID-19 infection (fever, chills, cough, or new shortness of breath).    Past Medical History:  Diagnosis Date  . Anxiety   . PTSD (post-traumatic stress disorder)    Past Surgical History:  Procedure Laterality Date  . ABDOMINAL HYSTERECTOMY       Current Meds  Medication Sig  . buPROPion (WELLBUTRIN XL) 300 MG 24 hr tablet Take 300 mg by mouth daily.  Marland Kitchen venlafaxine XR (EFFEXOR-XR) 75 MG 24 hr capsule Take 75 mg by mouth daily.      Allergies:   Codeine, Morphine and related, and Sulfa antibiotics   Social History   Tobacco Use  . Smoking status: Never Smoker  . Smokeless tobacco: Never Used  Substance Use Topics  . Alcohol use: Yes    Alcohol/week: 7.0 standard drinks    Types: 7 Glasses of wine per week  . Drug use: No     Family Hx: The patient's family history includes Heart attack (age of onset: 60) in her father; Heart disease in her mother.  ROS:   Please see the history of present illness.   All other systems reviewed and are negative.   Prior CV studies:   The following studies were reviewed today:  14-day event monitor (08/29/2018): Predominantly sinus rhythm with rare PACs and PVCs.  No significant arrhythmia.  Labs/Other Tests and Data Reviewed:    EKG:  No ECG reviewed.  Recent Labs: 08/17/2018: ALT 21; BUN 16; Creatinine, Ser 0.93; Hemoglobin 13.9; Platelets 293; Potassium 3.7; Sodium 141   Recent Lipid Panel No results found for: CHOL, TRIG, HDL, CHOLHDL, LDLCALC, LDLDIRECT  Wt Readings from Last 3 Encounters:  10/09/18 175 lb (79.4 kg)  08/19/18 178 lb (80.7 kg)  08/17/18 178 lb (80.7 kg)     Objective:    Vital Signs:  BP 127/82 (BP Location: Left Arm, Patient Position: Sitting, Cuff Size: Normal)   Pulse 71   Ht 5\' 3"  (1.6 m)   Wt 175  lb (79.4 kg)   BMI 31.00 kg/m    VITAL SIGNS:  reviewed GEN:  no acute distress  ASSESSMENT & PLAN:    PAC's and PVC's: Rare supraventricular and ventricular ectopic beats noted, which likely account for Sherri Powers's palpitations.  Frequency is improving.  I encouraged Sherri Powers to minimize her caffeine consumption, particularly if palpitations worsen.  We will defer additional testing at this time.  We have discussed role for pharmacotherapy (I.e beta-blockers and calcium channel blockers) but will defer adding this given minimal symptoms.  Chest pain: No further episodes since our last visit.  Given atypical nature, we will defer additional testing.  Episodic shortness of breath that seems to be associated with anxiety is also improving.  COVID-19 Education: The signs and symptoms of COVID-19 were discussed with the patient and how to seek care for testing (follow up with PCP or arrange E-visit).  The importance of social distancing was discussed today.  Time:   Today, I have spent 8 minutes with the patient with telehealth technology discussing the above problems.     Medication Adjustments/Labs and Tests Ordered: Current medicines are reviewed at length with the patient today.  Concerns regarding medicines are outlined above.   Tests Ordered: None.  Medication Changes: None  Follow Up:  As needed.  Signed, Yvonne Kendallhristopher Ryelee Albee, MD  10/09/2018 8:09 AM    [facility]

## 2019-11-25 ENCOUNTER — Other Ambulatory Visit: Payer: Self-pay | Admitting: Family Medicine

## 2019-11-25 DIAGNOSIS — Z1231 Encounter for screening mammogram for malignant neoplasm of breast: Secondary | ICD-10-CM

## 2021-02-18 IMAGING — DX PORTABLE CHEST - 1 VIEW
1 series · 1 of 1 positions shown · non-contrast
Comparison: None.

CLINICAL DATA: Chest pain radiating to left arm beginning today.

EXAM:
PORTABLE CHEST 1 VIEW

[chest ap]
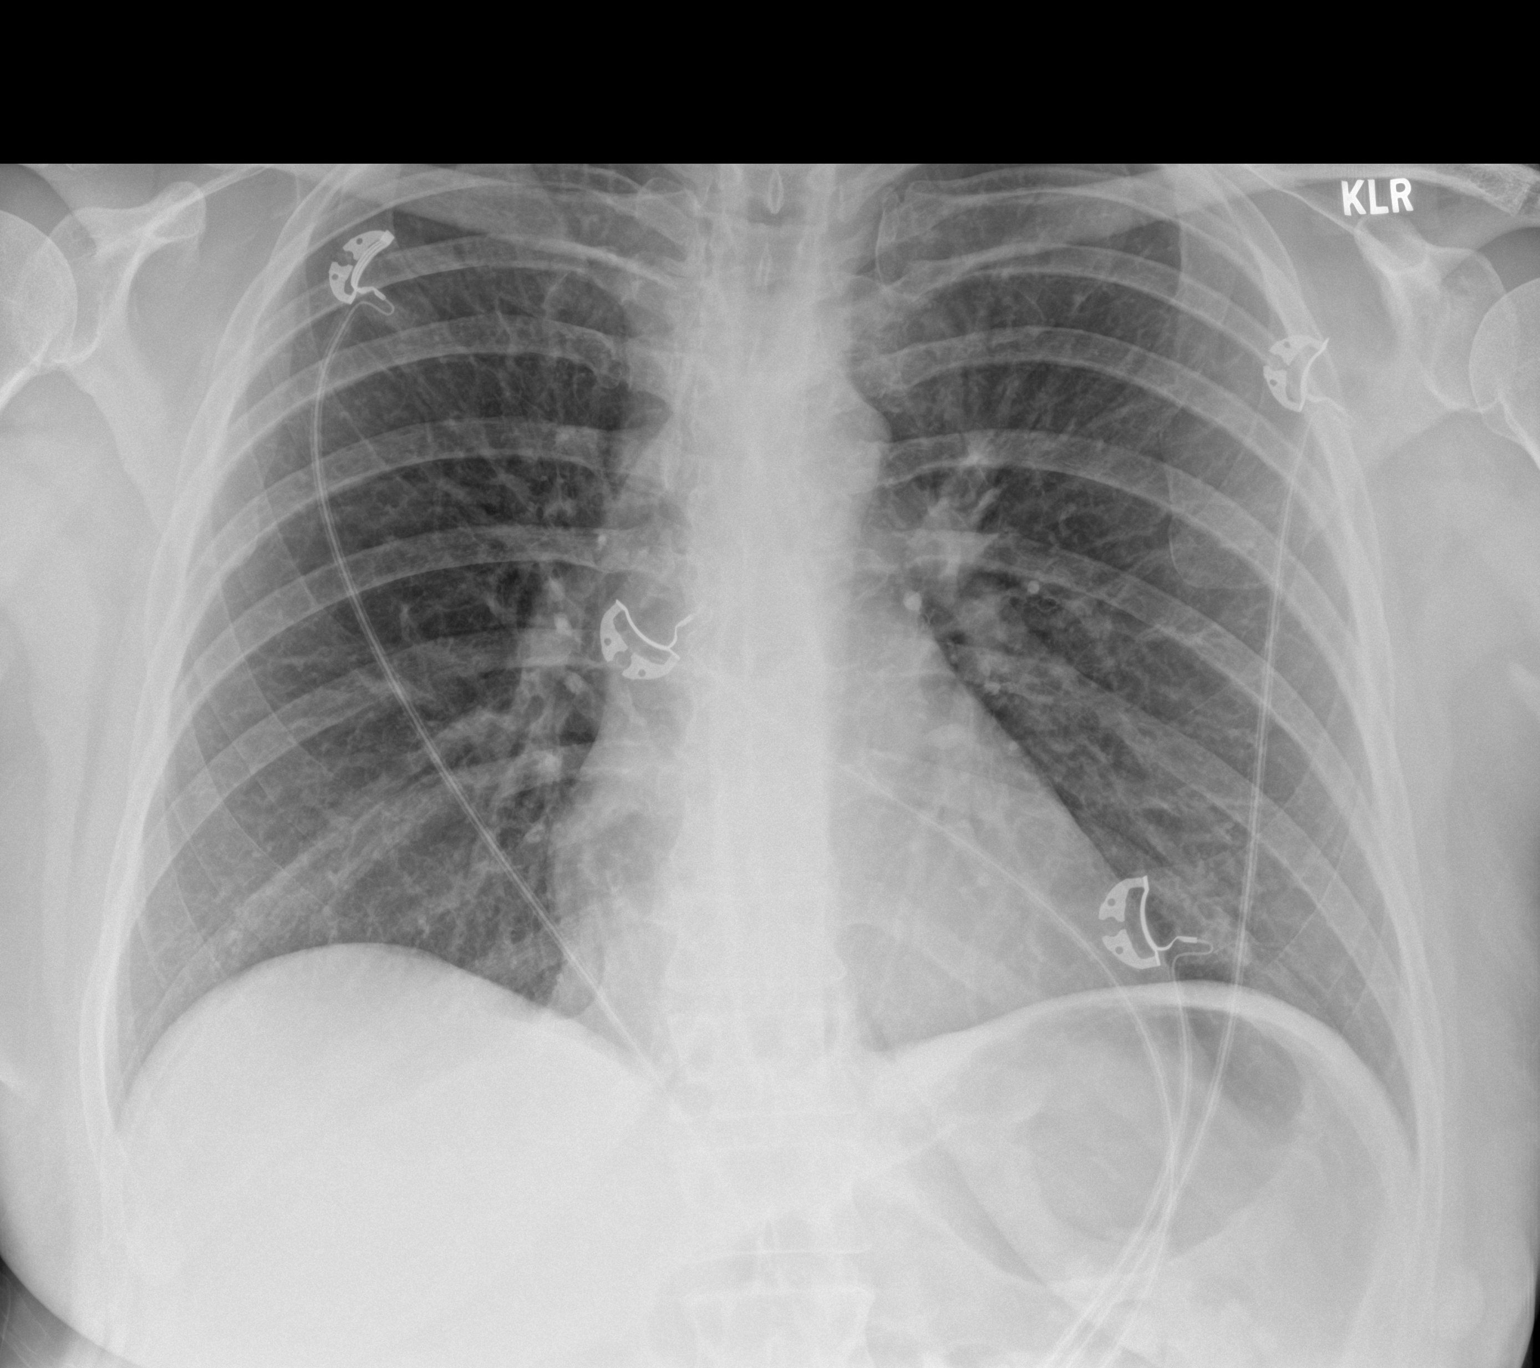

[1 of 1 positions shown; findings below may reference images not displayed]

FINDINGS: The heart size and mediastinal contours are within normal limits.
Both lungs are clear. No evidence of pneumothorax or pleural
effusion.
IMPRESSION: No active disease.

## 2021-02-27 ENCOUNTER — Encounter: Payer: Self-pay | Admitting: Emergency Medicine

## 2021-02-27 ENCOUNTER — Ambulatory Visit
Admission: EM | Admit: 2021-02-27 | Discharge: 2021-02-27 | Disposition: A | Discharge status: Home / Self Care | Payer: 59

## 2021-02-27 ENCOUNTER — Ambulatory Visit (INDEPENDENT_AMBULATORY_CARE_PROVIDER_SITE_OTHER): Payer: 59

## 2021-02-27 DIAGNOSIS — R519 Headache, unspecified: Secondary | ICD-10-CM

## 2021-02-27 DIAGNOSIS — R058 Other specified cough: Secondary | ICD-10-CM | POA: Diagnosis not present

## 2021-02-27 DIAGNOSIS — R509 Fever, unspecified: Secondary | ICD-10-CM

## 2021-02-27 DIAGNOSIS — R059 Cough, unspecified: Secondary | ICD-10-CM

## 2021-02-27 MED ORDER — AMOXICILLIN-POT CLAVULANATE 875-125 MG PO TABS: 1.0000 | ORAL_TABLET | Freq: Two times a day (BID) | ORAL | 0 refills | Status: DC

## 2021-02-27 NOTE — Discharge Instructions (Addendum)
Use Flonase 2 sprays twice a day for 7 days for sinus inflammation and pain. If you do not improve in 48 hours and continue with the fevers, then fill the prescription of the antibiotic.

## 2021-02-27 NOTE — ED Provider Notes (Signed)
[facility]    CSN: [id number] Arrival date & time: 02/27/21  1148      History   Chief Complaint Chief Complaint  Patient presents with   Cough   Sinus pressure    Fever    HPI Sherri Powers is a 60 y.o. female who presents with cough, sinus pressure and pain x 4 days. Had negative covid and flu test yesterday. Fever has been in the low 100's. Has been well in the past month. Has not had sinus surgeries. The last day she had a fever was yesterday of 100. Today was only 99.  Her cough is non productive and worse in the am. Had covid in September and she still has a cough, now for 2 months. She is here because of her sinus pressure behind her eyes. She has not taken anything for her HA since she is on Mobic and did not know what to take. Denies use of cold meds ,saline rinses or nose sprays.     Past Medical History:  Diagnosis Date   Anxiety    PTSD (post-traumatic stress disorder)     Patient Active Problem List   Diagnosis Date Noted   PAC (premature atrial contraction) 10/09/2018   PVC (premature ventricular contraction) 10/09/2018   Palpitations 08/19/2018   Atypical chest pain 08/19/2018    Past Surgical History:  Procedure Laterality Date   ABDOMINAL HYSTERECTOMY      OB History   No obstetric history on file.      Home Medications    Prior to Admission medications   Medication Sig Start Date End Date Taking? Authorizing Provider  amoxicillin-clavulanate (AUGMENTIN) 875-125 MG tablet Take 1 tablet by mouth every 12 (twelve) hours. 02/27/21  Yes Rodriguez-Southworth, Nettie Elm, PA-C  buPROPion (WELLBUTRIN XL) 300 MG 24 hr tablet Take 300 mg by mouth daily.   Yes [provider]  meloxicam (MOBIC) 15 MG tablet Take 15 mg by mouth daily. 01/09/21  Yes [provider]  venlafaxine XR (EFFEXOR-XR) 75 MG 24 hr capsule Take 75 mg by mouth daily.  08/10/18  Yes [provider]  ezetimibe (ZETIA) 10 MG tablet Take 10 mg by mouth  daily. 10/24/20   [provider]    Family History Family History  Problem Relation Age of Onset   Heart disease Mother        Atrial myxoma   Heart attack Father 19    Social History Social History   Tobacco Use   Smoking status: Never   Smokeless tobacco: Never  Vaping Use   Vaping Use: Never used  Substance Use Topics   Alcohol use: Yes    Alcohol/week: 7.0 standard drinks    Types: 7 Glasses of wine per week   Drug use: No     Allergies   Codeine, Morphine and related, and Sulfa antibiotics   Review of Systems Review of Systems  Constitutional:  Positive for fatigue and fever. Negative for appetite change.  HENT:  Positive for sinus pressure. Negative for congestion, ear discharge, ear pain, rhinorrhea and sore throat.   Eyes:  Negative for discharge.  Respiratory:  Positive for cough. Negative for shortness of breath.   Skin:  Negative for rash.  Neurological:  Positive for headaches.    Physical Exam Triage Vital Signs ED Triage Vitals [02/27/21 1235]  Enc Vitals Group     BP (!) 149/84     Pulse Rate 84     Resp  Temp 98.3 F (36.8 C)     Temp Source Oral     SpO2 96 %     Weight      Height      Head Circumference      Peak Flow      Pain Score      Pain Loc      Pain Edu?      Excl. in GC?    No data found.  Updated Vital Signs BP (!) 149/84 (BP Location: Left Arm)   Pulse 84   Temp 98.3 F (36.8 C) (Oral)   SpO2 96%   Visual Acuity Right Eye Distance:   Left Eye Distance:   Bilateral Distance:    Right Eye Near:   Left Eye Near:    Bilateral Near:     Physical Exam Physical Exam Vitals signs and nursing note reviewed.  Constitutional:      General: She is not in acute distress.    Appearance: Normal appearance. She is not ill-appearing, toxic-appearing. He skin is a little clammy.  HENT:     Head: Normocephalic. The HA is not provoked with bending over.     Right Ear: Tympanic membrane, ear canal and external  ear normal.     Left Ear: Tympanic membrane, ear canal and external ear normal.     Nose: with mild mucosa congestion. Has tenderness on all her sinuses, but all have good transillumination.     Mouth/Throat:     Mouth: Mucous membranes are moist.  Eyes:     General: No scleral icterus.       Right eye: No discharge.        Left eye: No discharge.     Conjunctiva/sclera: Conjunctivae normal.  Neck:     Musculoskeletal: Neck supple. No neck rigidity.  Cardiovascular:     Rate and Rhythm: Normal rate and regular rhythm.     Heart sounds: No murmur.  Pulmonary:     Effort: Pulmonary effort is normal.     Breath sounds: Normal breath sounds.   Musculoskeletal: Normal range of motion.  Lymphadenopathy:     Cervical: No cervical adenopathy.  Skin:    General: Skin is warm and dry.     Coloration: Skin is not jaundiced.     Findings: No rash.  Neurological:     Mental Status: She is alert and oriented to person, place, and time.     Gait: Gait normal.  Psychiatric:        Mood and Affect: Mood normal.        Behavior: Behavior normal.        Thought Content: Thought content normal.        Judgment: Judgment normal.    UC Treatments / Results  Labs (all labs ordered are listed, but only abnormal results are displayed) Labs Reviewed - No data to display  EKG   Radiology DG Chest 2 View  Result Date: 02/27/2021 CLINICAL DATA:  COVID infection about a month ago, persistent cough and low-grade fever. EXAM: CHEST - 2 VIEW COMPARISON:  08/17/2018 FINDINGS: The lungs appear clear. Cardiac and mediastinal contours normal. No pleural effusion identified. Mild thoracic spondylosis. IMPRESSION: No active cardiopulmonary disease. Electronically Signed   By: Gaylyn Rong M.D.   On: 02/27/2021 13:23    Procedures Procedures (including critical care time)  Medications Ordered in UC Medications - No data to display  Initial Impression / Assessment and Plan / UC Course  I have  reviewed the triage vital signs and the nursing notes. Pertinent imaging results that were available during my care of the patient were reviewed by me and considered in my medical decision making (see chart for details). Seems to have viral sinusitis and was placed on Flonase as noted. If she gets worse after 48 hours, and fever continues, then she may fill the rx I gave her for Augmentin as noted.      Final Clinical Impressions(s) / UC Diagnoses   Final diagnoses:  Sinus headache  Other cough     Discharge Instructions      Use Flonase 2 sprays twice a day for 7 days for sinus inflammation and pain. If you do not improve in 48 hours and continue with the fevers, then fill the prescription of the antibiotic.      ED Prescriptions     Medication Sig Dispense Auth. Provider   amoxicillin-clavulanate (AUGMENTIN) 875-125 MG tablet Take 1 tablet by mouth every 12 (twelve) hours. 14 tablet Rodriguez-Southworth, Nettie Elm, PA-C      PDMP not reviewed this encounter.   Garey Ham, PA-C 02/27/21 2023

## 2021-02-27 NOTE — ED Triage Notes (Signed)
Pt presents with fever, cough, and sinus pressure/pain x 4 days. Pt was tested yesterday for covid and flu and it was negative.

## 2023-09-01 IMAGING — DX DG CHEST 2V
2 series · 2 of 2 positions shown · non-contrast
Comparison: 08/17/2018

CLINICAL DATA: COVID infection about a month ago, persistent cough
and low-grade fever.

EXAM:
CHEST - 2 VIEW

[chest pa]
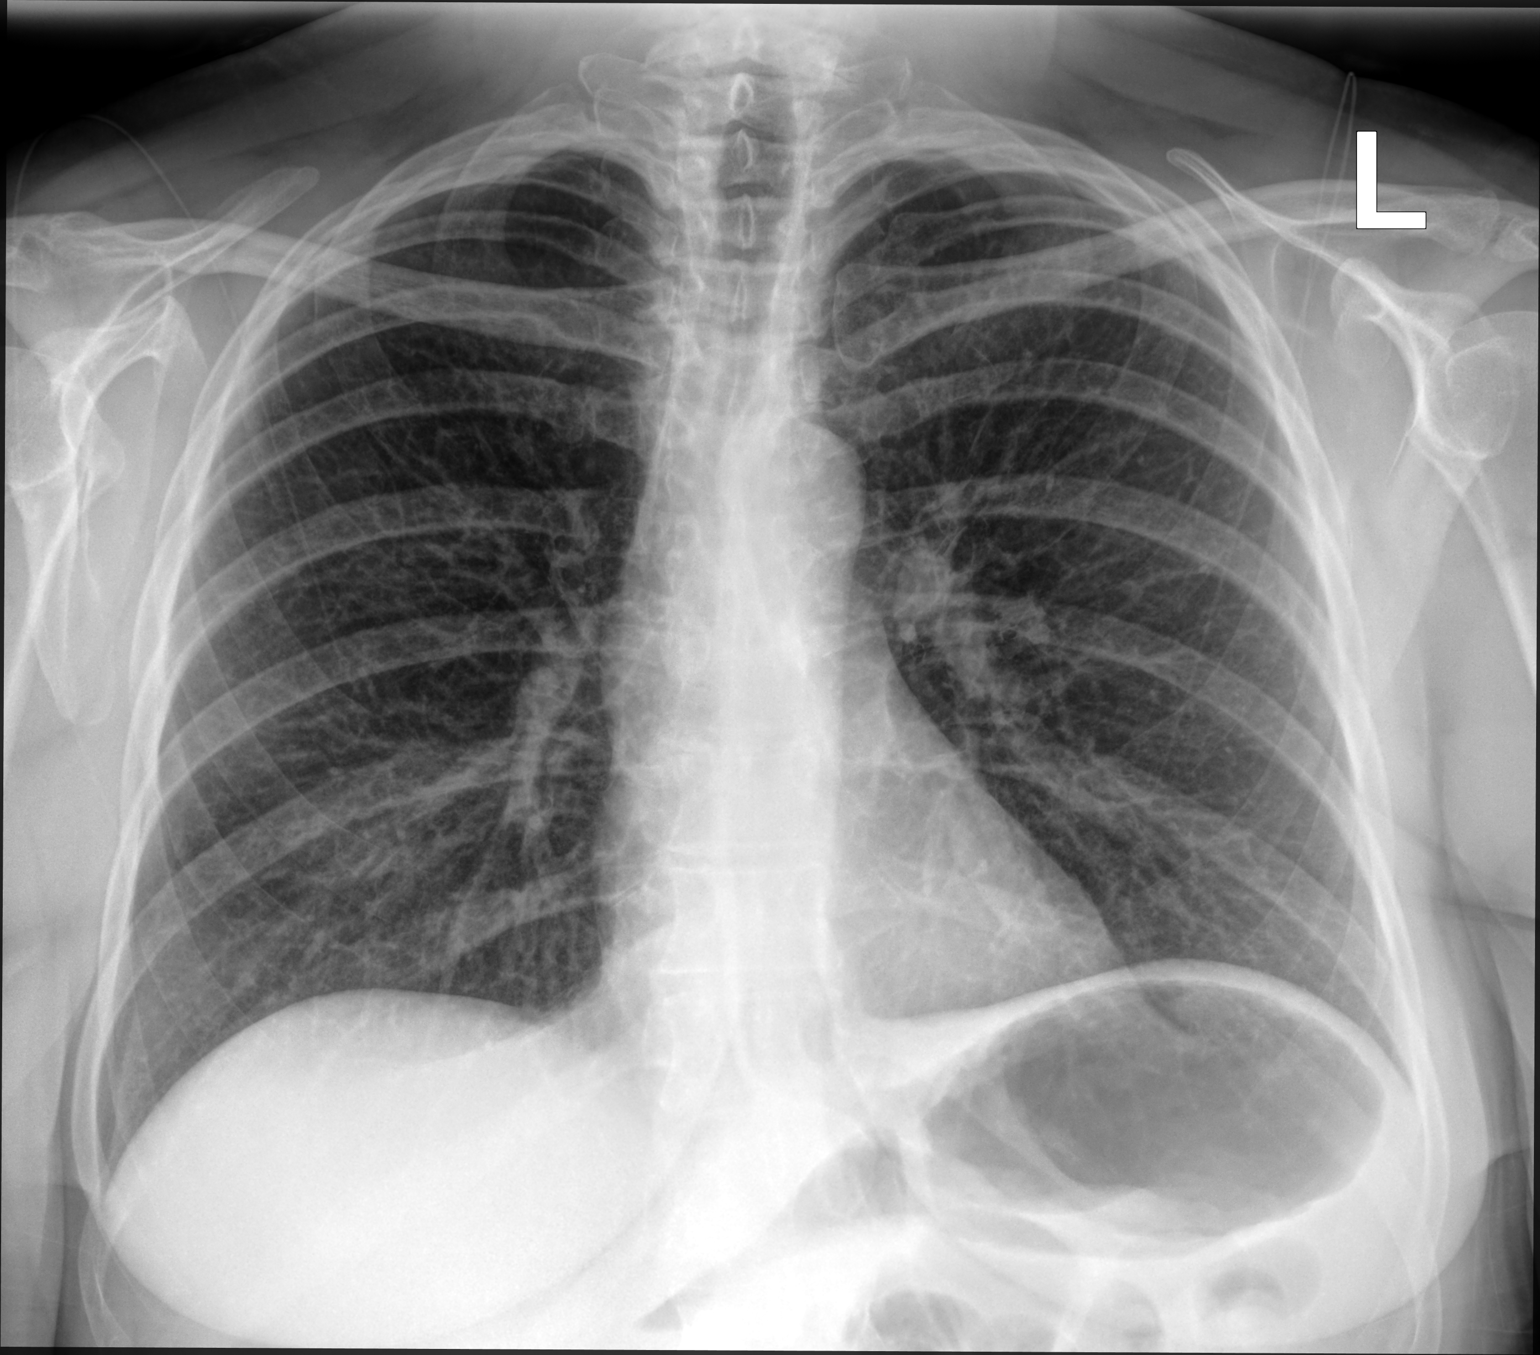

[chest lat]
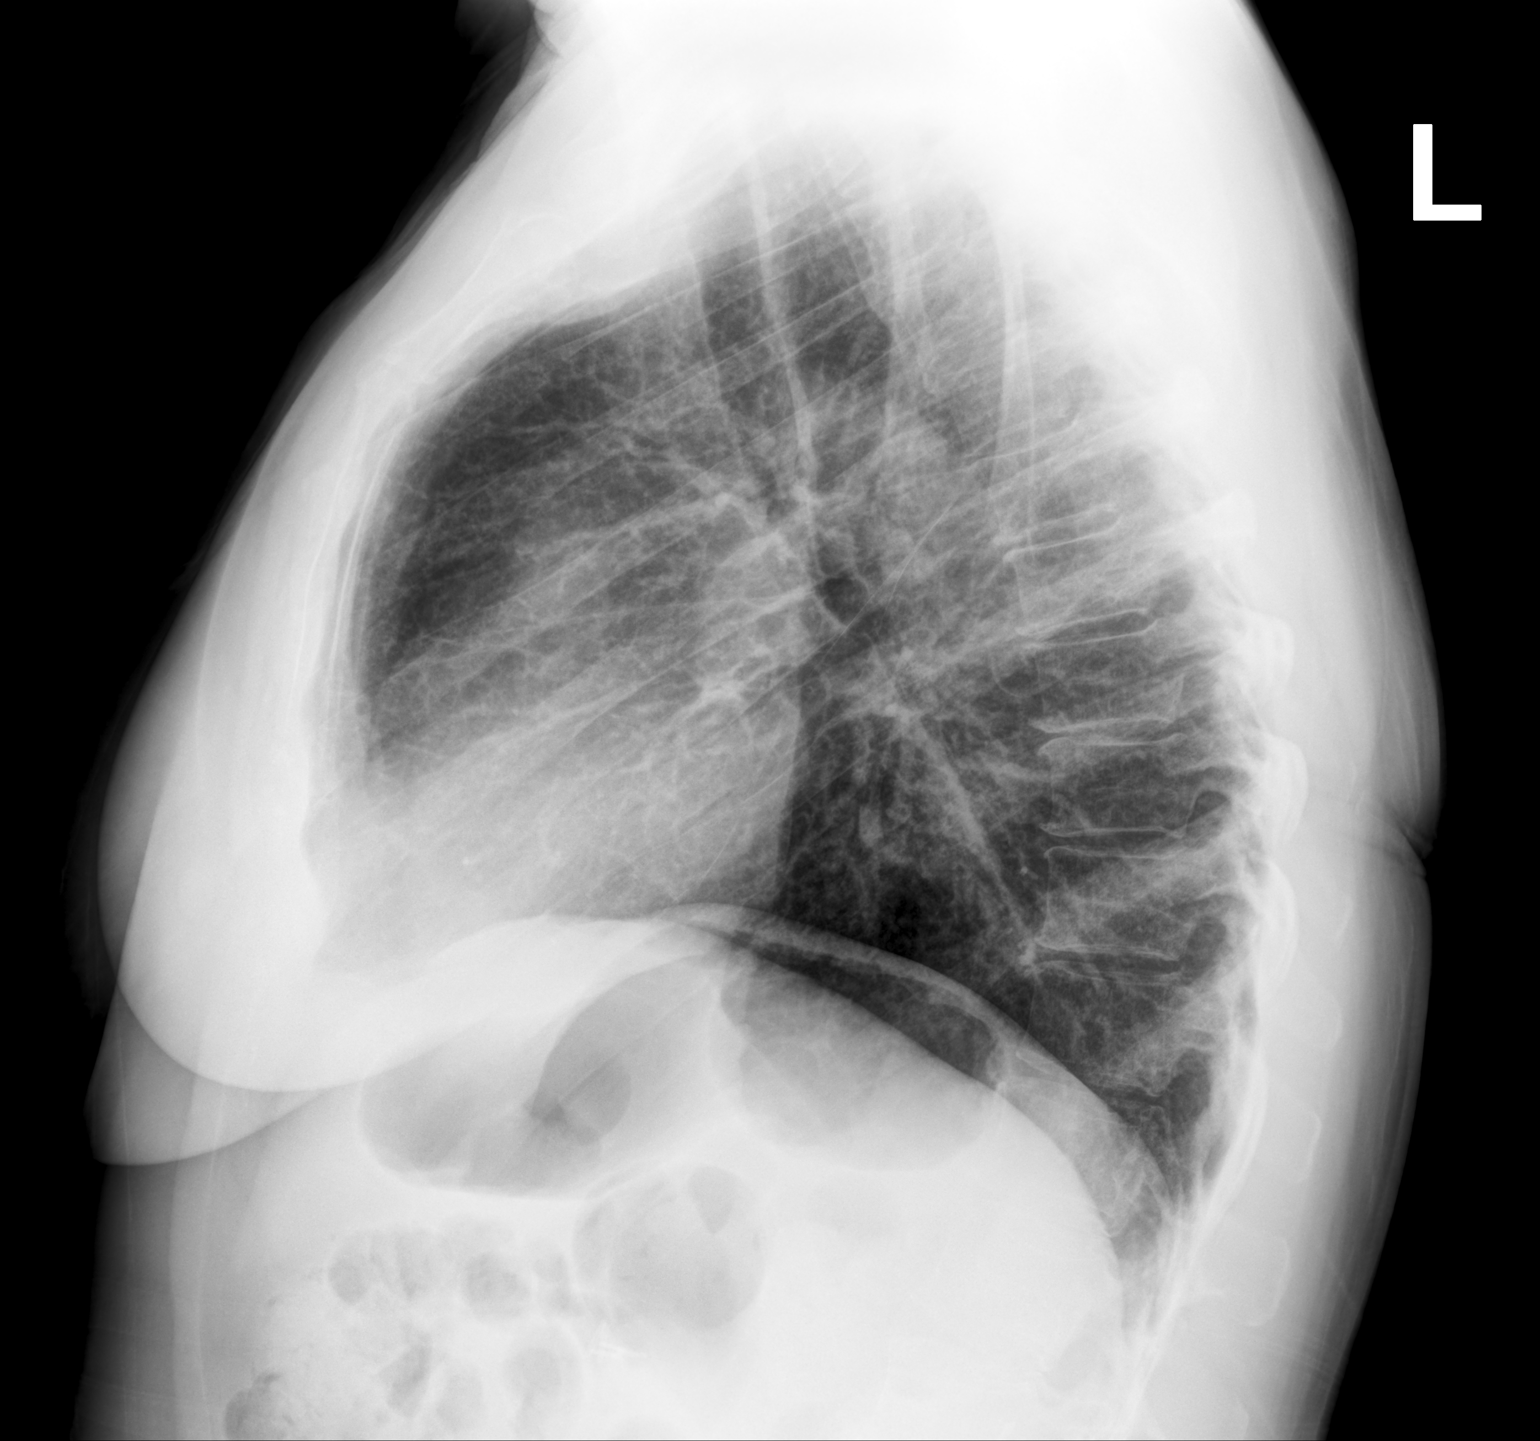

[2 of 2 positions shown; findings below may reference images not displayed]

FINDINGS: The lungs appear clear. Cardiac and mediastinal contours normal. No
pleural effusion identified. Mild thoracic spondylosis.
IMPRESSION: No active cardiopulmonary disease.

## 2024-05-21 ENCOUNTER — Ambulatory Visit

## 2024-05-21 ENCOUNTER — Encounter: Payer: Self-pay | Admitting: Emergency Medicine

## 2024-05-21 ENCOUNTER — Ambulatory Visit: Admission: EM | Admit: 2024-05-21 | Discharge: 2024-05-21 | Disposition: A | Discharge status: Home / Self Care

## 2024-05-21 DIAGNOSIS — M25532 Pain in left wrist: Secondary | ICD-10-CM | POA: Diagnosis not present

## 2024-05-21 DIAGNOSIS — M25512 Pain in left shoulder: Secondary | ICD-10-CM | POA: Diagnosis not present

## 2024-05-21 DIAGNOSIS — S52122A Displaced fracture of head of left radius, initial encounter for closed fracture: Secondary | ICD-10-CM

## 2024-05-21 DIAGNOSIS — M79602 Pain in left arm: Secondary | ICD-10-CM

## 2024-05-21 DIAGNOSIS — S62002A Unspecified fracture of navicular [scaphoid] bone of left wrist, initial encounter for closed fracture: Secondary | ICD-10-CM

## 2024-05-21 MED ORDER — TRAMADOL HCL 50 MG PO TABS: 50.0000 mg | ORAL_TABLET | Freq: Four times a day (QID) | ORAL | 0 refills | Status: AC | PRN

## 2024-05-21 MED ORDER — ONDANSETRON 4 MG PO TBDP: 4.0000 mg | ORAL_TABLET | Freq: Four times a day (QID) | ORAL | 0 refills | Status: AC | PRN

## 2024-05-21 NOTE — ED Triage Notes (Signed)
 Patient states that she fell today and injured her left wrist.  Patient states that she is having pain all the way up her left arm.

## 2024-05-21 NOTE — ED Provider Notes (Signed)
 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 She reports slipping on ice and says her arm was hyperextended behind her and she came down on her hand. Reports significant pain in the wrist, forearm and mildly in the elbow.  She has some swelling in the wrist.  No contusions or wounds.  Reports tingling sensation of the dorsal hand.  Increased pain when flexing and extending wrist and flexing elbow.  She is not reporting head injury or loss of consciousness.  She has not taken anything for pain relief and came straight to the urgent care.  HPI  Past Medical History:  Diagnosis Date   Anxiety    PTSD (post-traumatic stress disorder)     Patient Active Problem List   Diagnosis Date Noted   PAC (premature atrial contraction) 10/09/2018   PVC (premature ventricular contraction) 10/09/2018   Palpitations 08/19/2018   Atypical chest pain 08/19/2018    Past Surgical History:  Procedure Laterality Date   ABDOMINAL HYSTERECTOMY      OB History   No obstetric history on file.      Home Medications    Prior to Admission medications  Medication Sig Start Date End Date Taking? Authorizing Provider  hydrOXYzine (ATARAX) 10 MG tablet Take 10 mg by mouth. 05/03/24  Yes [provider]  losartan (COZAAR) 25 MG tablet Take 25 mg by mouth daily. 05/18/24 05/18/25 Yes [provider]  ondansetron  (ZOFRAN -ODT) 4 MG disintegrating tablet Take 1 tablet (4 mg total) by mouth every 6 (six) hours as needed for nausea. 05/21/24  Yes Arvis Jolan NOVAK, PA-C  REPATHA SURECLICK 140 MG/ML SOAJ Inject 140 mg into the skin. 05/20/24  Yes [provider]  rosuvastatin (CRESTOR) 5 MG tablet Take 5 mg by  mouth daily. 04/30/24  Yes [provider]  traMADol  (ULTRAM ) 50 MG tablet Take 1 tablet (50 mg total) by mouth every 6 (six) hours as needed for severe pain (pain score 7-10). 05/21/24  Yes Arvis Jolan NOVAK, PA-C  aspirin  81 MG chewable tablet Chew 81 mg by mouth.    [provider]  buPROPion (WELLBUTRIN XL) 300 MG 24 hr tablet Take 300 mg by mouth daily.    [provider]  ezetimibe (ZETIA) 10 MG tablet Take 10 mg by mouth daily. 10/24/20   [provider]  meloxicam (MOBIC) 15 MG tablet Take 15 mg by mouth daily. 01/09/21   [provider]  venlafaxine XR (EFFEXOR-XR) 75 MG 24 hr capsule Take 75 mg by mouth daily.  08/10/18   [provider]    Family History Family History  Problem Relation Age of Onset   Heart disease Mother        Atrial myxoma   Heart attack Father 5    Social History Social History[1]   Allergies   Codeine, Morphine and codeine, and Sulfa antibiotics   Review of Systems Review of Systems  Musculoskeletal:  Positive for arthralgias and joint swelling.  Skin:  Negative for color change, rash and wound.  Neurological:  Negative for weakness and numbness.     Physical Exam Triage Vital Signs ED Triage Vitals  Encounter Vitals  Group     BP 05/21/24 1848 (!) 175/85     Girls Systolic BP Percentile --      Girls Diastolic BP Percentile --      Boys Systolic BP Percentile --      Boys Diastolic BP Percentile --      Pulse Rate 05/21/24 1848 83     Resp 05/21/24 1848 15     Temp 05/21/24 1848 97.6 F (36.4 C)     Temp Source 05/21/24 1848 Oral     SpO2 05/21/24 1848 97 %     Weight 05/21/24 1846 175 lb 0.7 oz (79.4 kg)     Height 05/21/24 1846 5' 3 (1.6 m)     Head Circumference --      Peak Flow --      Pain Score 05/21/24 1846 4     Pain Loc --      Pain Education --      Exclude from Growth Chart --    No data found.  Updated Vital Signs BP (!) 175/85 (BP Location: Right Arm)   Pulse 83    Temp 97.6 F (36.4 C) (Oral)   Resp 15   Ht 5' 3 (1.6 m)   Wt 175 lb 0.7 oz (79.4 kg)   SpO2 97%   BMI 31.01 kg/m      Physical Exam Vitals and nursing note reviewed.  Constitutional:      General: She is not in acute distress.    Appearance: Normal appearance. She is not ill-appearing or toxic-appearing.  HENT:     Head: Normocephalic and atraumatic.  Eyes:     General: No scleral icterus.       Right eye: No discharge.        Left eye: No discharge.     Conjunctiva/sclera: Conjunctivae normal.  Cardiovascular:     Rate and Rhythm: Normal rate and regular rhythm.     Heart sounds: Normal heart sounds.  Pulmonary:     Effort: Pulmonary effort is normal. No respiratory distress.     Breath sounds: Normal breath sounds.  Musculoskeletal:     Left elbow: Normal range of motion. Tenderness present in radial head and medial epicondyle.     Left forearm: Swelling and tenderness (throughout radial aspect of forearm) present.     Left wrist: Swelling and tenderness (distal ulna, distal radius, DRUJ) present. Decreased range of motion. Normal pulse.     Left hand: Normal.     Cervical back: Neck supple.  Skin:    General: Skin is dry.  Neurological:     General: No focal deficit present.     Mental Status: She is alert. Mental status is at baseline.     Motor: No weakness.     Gait: Gait normal.  Psychiatric:        Mood and Affect: Mood normal.        Behavior: Behavior normal.      UC Treatments / Results  Labs (all labs ordered are listed, but only abnormal results are displayed) Labs Reviewed - No data to display  EKG   Radiology No results found.  Procedures Procedures (including critical care time)  Medications Ordered in UC Medications - No data to display  Initial Impression / Assessment and Plan / UC Course  I have reviewed the triage vital signs and the nursing notes.  Pertinent labs & imaging results that were available during my care of the  patient were reviewed by  me and considered in my medical decision making (see chart for details).   64 year old female presents for pain of the left wrist, forearm and elbow that occurred today when she slipped on ice and fell onto an outstretched arm.  Will obtain imaging of left wrist, forearm and elbow.  *** Final Clinical Impressions(s) / UC Diagnoses   Final diagnoses:  Left arm pain  Closed displaced fracture of head of left radius, initial encounter  Left wrist pain  Acute pain of left shoulder     Discharge Instructions      -You broke a bone in your elbow -We have placed you in a splint and sling. Do not get the splint wet -You'll need to follow up with orthopedics in the next few days -Take ibuprofen and tylenol as needed for pain -I sent tramadol  as needed for pain. Try zofran  30 min or so before taking pain meds so you aren't nauseous  -No use of affected extremity until cleared by orthopedics  You have a condition requiring you to follow up with Orthopedics so please call one of the following office for appointment:   [facility] Address: [address] [place], [place] [address] Phone: [phone]  [facility] [address], [place], [place] [address] Phone: [phone]  [facility] [address] [place], [place] [address] Phone: [phone]      ED Prescriptions     Medication Sig Dispense Auth. Provider   traMADol  (ULTRAM ) 50 MG tablet Take 1 tablet (50 mg total) by mouth every 6 (six) hours as needed for severe pain (pain score 7-10). 15 tablet Arvis Huxley B, PA-C   ondansetron  (ZOFRAN -ODT) 4 MG disintegrating tablet Take 1 tablet (4 mg total) by mouth every 6 (six) hours as needed for nausea. 15 tablet Arvis Huxley NOVAK, PA-C      I have reviewed the PDMP during this encounter.     [1]  Social History Tobacco Use   Smoking status: Never   Smokeless tobacco: Never  Vaping Use   Vaping status: Never Used  Substance Use Topics    Alcohol use: Yes    Alcohol/week: 7.0 standard drinks of alcohol    Types: 7 Glasses of wine per week   Drug use: No   "

## 2024-05-21 NOTE — Discharge Instructions (Addendum)
-  You broke a bone in your elbow -We have placed you in a splint and sling. Do not get the splint wet -You'll need to follow up with orthopedics in the next few days -Take ibuprofen and tylenol as needed for pain -I sent tramadol  as needed for pain. Try zofran  30 min or so before taking pain meds so you aren't nauseous  -No use of affected extremity until cleared by orthopedics  You have a condition requiring you to follow up with Orthopedics so please call one of the following office for appointment:   [facility] Address: [address] [place], [place] [address] Phone: [phone]  [facility] [address] [place], [place] [address] Phone: [phone]  [facility] [address] [place], [place] [address] Phone: [phone]

## 2024-05-22 ENCOUNTER — Ambulatory Visit (HOSPITAL_COMMUNITY): Payer: Self-pay
